# Patient Record
Sex: Male | Born: 1995 | Hispanic: Yes | Marital: Single | Smoking: Never smoker
Health system: Southern US, Community
[De-identification: ages and names within clinical notes are randomized; demographics above are authoritative.]

## PROBLEM LIST (undated history)

## (undated) HISTORY — PX: APPENDECTOMY: SHX54

---
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
## 2019-08-21 ENCOUNTER — Observation Stay
Admission: EM | Admit: 2019-08-21 | Discharge: 2019-08-22 | Disposition: A | Discharge status: Home / Self Care | Payer: Managed Care, Other (non HMO) | Attending: Surgery | Admitting: Surgery

## 2019-08-21 ENCOUNTER — Encounter: Payer: Self-pay | Admitting: Radiology

## 2019-08-21 ENCOUNTER — Other Ambulatory Visit: Payer: Self-pay

## 2019-08-21 ENCOUNTER — Emergency Department: Payer: Managed Care, Other (non HMO)

## 2019-08-21 DIAGNOSIS — K3580 Unspecified acute appendicitis: Secondary | ICD-10-CM | POA: Diagnosis not present

## 2019-08-21 DIAGNOSIS — Z20828 Contact with and (suspected) exposure to other viral communicable diseases: Secondary | ICD-10-CM | POA: Insufficient documentation

## 2019-08-21 LAB — CBC
HCT: 41.4 % (ref 39.0–52.0)
Hemoglobin: 14.2 g/dL (ref 13.0–17.0)
MCH: 32.1 pg (ref 26.0–34.0)
MCHC: 34.3 g/dL (ref 30.0–36.0)
MCV: 93.7 fL (ref 80.0–100.0)
Platelets: 191 10*3/uL (ref 150–400)
RBC: 4.42 MIL/uL (ref 4.22–5.81)
RDW: 11.9 % (ref 11.5–15.5)
WBC: 12.8 10*3/uL — ABNORMAL HIGH (ref 4.0–10.5)
nRBC: 0 % (ref 0.0–0.2)

## 2019-08-21 LAB — COMPREHENSIVE METABOLIC PANEL
ALT: 14 U/L (ref 0–44)
AST: 20 U/L (ref 15–41)
Albumin: 4.5 g/dL (ref 3.5–5.0)
Alkaline Phosphatase: 67 U/L (ref 38–126)
Anion gap: 9 (ref 5–15)
BUN: 15 mg/dL (ref 6–20)
CO2: 25 mmol/L (ref 22–32)
Calcium: 9.1 mg/dL (ref 8.9–10.3)
Chloride: 104 mmol/L (ref 98–111)
Creatinine, Ser: 0.83 mg/dL (ref 0.61–1.24)
GFR calc Af Amer: >60 mL/min (ref >60)
GFR calc non Af Amer: >60 mL/min (ref >60)
Glucose, Bld: 112 mg/dL — ABNORMAL HIGH (ref 70–99)
Potassium: 3.3 mmol/L — ABNORMAL LOW (ref 3.5–5.1)
Sodium: 138 mmol/L (ref 135–145)
Total Bilirubin: 1.7 mg/dL — ABNORMAL HIGH (ref 0.3–1.2)
Total Protein: 7.3 g/dL (ref 6.5–8.1)

## 2019-08-21 LAB — URINALYSIS, COMPLETE (UACMP) WITH MICROSCOPIC
Bacteria, UA: NONE SEEN
Bilirubin Urine: NEGATIVE
Glucose, UA: NEGATIVE mg/dL
Hgb urine dipstick: NEGATIVE
Ketones, ur: NEGATIVE mg/dL
Leukocytes,Ua: NEGATIVE
Nitrite: NEGATIVE
Protein, ur: 30 mg/dL — AB
Specific Gravity, Urine: 1.031 — ABNORMAL HIGH (ref 1.005–1.030)
pH: 6 (ref 5.0–8.0)

## 2019-08-21 LAB — LIPASE, BLOOD: Lipase: 20 U/L (ref 11–51)

## 2019-08-21 LAB — SARS CORONAVIRUS 2 BY RT PCR (HOSPITAL ORDER, PERFORMED IN ~~LOC~~ HOSPITAL LAB): SARS Coronavirus 2: NEGATIVE

## 2019-08-21 MED ORDER — PIPERACILLIN-TAZOBACTAM 3.375 G IVPB 30 MIN
3.3750 g | Freq: Once | INTRAVENOUS | Status: AC
  Administered 2019-08-21: 22:00:00 3.375 g via INTRAVENOUS
  Filled 2019-08-21: qty 50

## 2019-08-21 MED ORDER — LACTATED RINGERS IV SOLN
INTRAVENOUS | Status: DC
  Administered 2019-08-21: 22:00:00 via INTRAVENOUS

## 2019-08-21 MED ORDER — MORPHINE SULFATE (PF) 4 MG/ML IV SOLN
4.0000 mg | Freq: Once | INTRAVENOUS | Status: AC
  Administered 2019-08-21: 4 mg via INTRAVENOUS
  Filled 2019-08-21: qty 1

## 2019-08-21 MED ORDER — HYDROCODONE-ACETAMINOPHEN 5-325 MG PO TABS
1.0000 | ORAL_TABLET | ORAL | Status: DC | PRN
  Administered 2019-08-22 (×2): 2 via ORAL
  Filled 2019-08-21 (×2): qty 2

## 2019-08-21 MED ORDER — TRAMADOL HCL 50 MG PO TABS: 50.0000 mg | ORAL_TABLET | Freq: Four times a day (QID) | ORAL | Status: DC | PRN

## 2019-08-21 MED ORDER — ONDANSETRON HCL 4 MG/2ML IJ SOLN
4.0000 mg | Freq: Once | INTRAMUSCULAR | Status: AC
  Administered 2019-08-21: 4 mg via INTRAVENOUS
  Filled 2019-08-21: qty 2

## 2019-08-21 MED ORDER — ONDANSETRON 4 MG PO TBDP: 4.0000 mg | ORAL_TABLET | Freq: Four times a day (QID) | ORAL | Status: DC | PRN

## 2019-08-21 MED ORDER — ONDANSETRON HCL 4 MG/2ML IJ SOLN: 4.0000 mg | Freq: Four times a day (QID) | INTRAMUSCULAR | Status: DC | PRN

## 2019-08-21 MED ORDER — DOCUSATE SODIUM 100 MG PO CAPS: 100.0000 mg | ORAL_CAPSULE | Freq: Two times a day (BID) | ORAL | Status: DC | PRN

## 2019-08-21 MED ORDER — MORPHINE SULFATE (PF) 2 MG/ML IV SOLN
2.0000 mg | INTRAVENOUS | Status: DC | PRN
  Administered 2019-08-22: 2 mg via INTRAVENOUS
  Filled 2019-08-21: qty 1

## 2019-08-21 MED ORDER — IOHEXOL 300 MG/ML  SOLN
100.0000 mL | Freq: Once | INTRAMUSCULAR | Status: AC | PRN
  Administered 2019-08-21: 20:00:00 100 mL via INTRAVENOUS
  Filled 2019-08-21: qty 100

## 2019-08-21 NOTE — H&P (Signed)
Subjective:   CC: acute appendicitis  HPI:  Anthony Avery is a 23 y.o. male who is consulted by Cyril LoosenKinner for evaluation of  above cc.  Symptoms were first noted 2 days ago. Pain is sharp, worsening, localized to periumbilical region.  Associated with nothing specific, exacerbated by nothing specific.     Past Medical History: none reported  Past Surgical History: none reported  Family History: reviewed and not relevant to CC  Social History: denies tobacco, alcohol  Current Medications: none reported  Allergies:  Allergies as of 08/21/2019  . (No Known Allergies)    ROS:  General: Denies weight loss, weight gain, fatigue, fevers, chills, and night sweats. Eyes: Denies blurry vision, double vision, eye pain, itchy eyes, and tearing. Ears: Denies hearing loss, earache, and ringing in ears. Nose: Denies sinus pain, congestion, infections, runny nose, and nosebleeds. Mouth/throat: Denies hoarseness, sore throat, bleeding gums, and difficulty swallowing. Heart: Denies chest pain, palpitations, racing heart, irregular heartbeat, leg pain or swelling, and decreased activity tolerance. Respiratory: Denies breathing difficulty, shortness of breath, wheezing, cough, and sputum. GI: Denies change in appetite, heartburn, nausea, vomiting, constipation, diarrhea, and blood in stool. GU: Denies difficulty urinating, pain with urinating, urgency, frequency, blood in urine. Musculoskeletal: Denies joint stiffness, pain, swelling, muscle weakness. Skin: Denies rash, itching, mass, tumors, sores, and boils Neurologic: Denies headache, fainting, dizziness, seizures, numbness, and tingling. Psychiatric: Denies depression, anxiety, difficulty sleeping, and memory loss. Endocrine: Denies heat or cold intolerance, and increased thirst or urination. Blood/lymph: Denies easy bruising, easy bruising, and swollen glands     Objective:     BP (!) 146/73   Pulse (!) 57   Temp 98.6 F (37 C)  (Oral)   Resp 20   Ht 6' (1.829 m)   Wt 83.9 kg   SpO2 99%   BMI 25.09 kg/m    Constitutional :  alert, cooperative, appears stated age and no distress  Lymphatics/Throat:  no asymmetry, masses, or scars  Respiratory:  clear to auscultation bilaterally  Cardiovascular:  regular rate and rhythm  Gastrointestinal: soft, non-tender; bowel sounds normal; no masses,  no organomegaly.   Musculoskeletal: Steady gait and movement  Skin: Cool and moist.  Psychiatric: Normal affect, non-agitated, not confused       LABS:  CMP Latest Ref Rng & Units 08/21/2019  Glucose 70 - 99 mg/dL 161(W112(H)  BUN 6 - 20 mg/dL 15  Creatinine 9.600.61 - 4.541.24 mg/dL 0.980.83  Sodium 119135 - 147145 mmol/L 138  Potassium 3.5 - 5.1 mmol/L 3.3(L)  Chloride 98 - 111 mmol/L 104  CO2 22 - 32 mmol/L 25  Calcium 8.9 - 10.3 mg/dL 9.1  Total Protein 6.5 - 8.1 g/dL 7.3  Total Bilirubin 0.3 - 1.2 mg/dL 8.2(N1.7(H)  Alkaline Phos 38 - 126 U/L 67  AST 15 - 41 U/L 20  ALT 0 - 44 U/L 14   CBC Latest Ref Rng & Units 08/21/2019  WBC 4.0 - 10.5 K/uL 12.8(H)  Hemoglobin 13.0 - 17.0 g/dL 56.214.2  Hematocrit 13.039.0 - 52.0 % 41.4  Platelets 150 - 400 K/uL 191     RADS: CLINICAL DATA:  Abdominal pain, mid abdominal pain for 1 day  EXAM: CT ABDOMEN AND PELVIS WITH CONTRAST  TECHNIQUE: Multidetector CT imaging of the abdomen and pelvis was performed using the standard protocol following bolus administration of intravenous contrast.  CONTRAST:  100mL OMNIPAQUE IOHEXOL 300 MG/ML  SOLN  COMPARISON:  None.  FINDINGS: Lower chest: Lung bases are clear. Normal heart  size. No pericardial effusion.  Hepatobiliary: No focal liver abnormality is seen. No gallstones, gallbladder wall thickening, or biliary dilatation.  Pancreas: Unremarkable. No pancreatic ductal dilatation or surrounding inflammatory changes.  Spleen: Normal in size without focal abnormality.  Adrenals/Urinary Tract: Adrenal glands are unremarkable. Kidneys  are normal, without renal calculi, focal lesion, or hydronephrosis. Bladder is unremarkable.  Stomach/Bowel: Distal esophagus, stomach and duodenal sweep are unremarkable. No bowel wall thickening or dilatation. No evidence of obstruction. Fluid-filled, hyperemic and dilated (11 mm) appendix in the right lower quadrant (2/53-65) with surrounding periappendiceal inflammation. No extraluminal gas or adjacent periappendiceal fluid collection. No colonic dilatation or wall thickening.  Vascular/Lymphatic: The aorta is normal caliber. Reactive nodes present in the right lower quadrant. No suspicious or enlarged lymph nodes in the included lymphatic chains.  Reproductive: The prostate and seminal vesicles are unremarkable.  Other: No abdominal wall hernia or abnormality. No abdominopelvic ascites.  Musculoskeletal: No acute osseous abnormality or suspicious osseous lesion.  IMPRESSION: Acute uncomplicated appendicitis. No evidence of perforation or abscess.   Electronically Signed   By: Lovena Le M.D.   On: 08/21/2019 20:26   Assessment:      Acute appendicitis  Plan:      Discussed the risk of surgery including post-op infxn, seroma, hematoma, abscess formation, chronic pain, poor-delayed wound healing, possible bowel resection, possible ostomy, possible conversion to open procedure, post-op SBO or ileus, and need for additional procedures to address said risks.  The risks of general anesthetic including MI, CVA, sudden death or even reaction to anesthetic medications also discussed. Alternatives include continued observation, or antibiotic treatment.  Benefits include possible symptom relief,   Typical post operative recovery of 3-5 days rest, also discussed.  The patient understands the risks, any and all questions were answered to the patient's satisfaction.  To OR after NPO for 8hrs.  Zosyn, IVF, pain control in the meantime.  Pt and family member agreeable  to plan

## 2019-08-21 NOTE — ED Notes (Signed)
Pt resting on stretcher with family at the bedside. Iv antibiotics infusing without difficulty. No acute distress noted. Pt reports pain has improved. awaiting admission.

## 2019-08-21 NOTE — ED Notes (Signed)
Pt ate chicken tacos at 1630. Per surgery pt would not qualify for surgical intervention around midnight. Family and patient aware.

## 2019-08-21 NOTE — ED Notes (Signed)
Dr. Kinner at the bedside for pt evaluation.  

## 2019-08-21 NOTE — ED Notes (Signed)
Surgeon at bedside with patient and patient's mother.

## 2019-08-21 NOTE — ED Notes (Signed)
Pt to CT

## 2019-08-21 NOTE — Anesthesia Preprocedure Evaluation (Signed)
Anesthesia Evaluation  Patient identified by MRN, date of birth, ID band Patient awake    Reviewed: Allergy & Precautions, NPO status , Patient's Chart, lab work & pertinent test results  Airway Mallampati: II  TM Distance: >3 FB     Dental  (+) Teeth Intact   Pulmonary neg pulmonary ROS,    Pulmonary exam normal        Cardiovascular negative cardio ROS Normal cardiovascular exam     Neuro/Psych negative neurological ROS     GI/Hepatic Neg liver ROS,   Endo/Other  negative endocrine ROS  Renal/GU negative Renal ROS  negative genitourinary   Musculoskeletal negative musculoskeletal ROS (+)   Abdominal Normal abdominal exam  (+)   Peds negative pediatric ROS (+)  Hematology negative hematology ROS (+)   Anesthesia Other Findings History reviewed. No pertinent past medical history.  Reproductive/Obstetrics                             Anesthesia Physical Anesthesia Plan  ASA: I and emergent  Anesthesia Plan: General   Post-op Pain Management:    Induction: Intravenous, Rapid sequence and Cricoid pressure planned  PONV Risk Score and Plan:   Airway Management Planned: Oral ETT  Additional Equipment:   Intra-op Plan:   Post-operative Plan: Extubation in OR  Informed Consent: I have reviewed the patients History and Physical, chart, labs and discussed the procedure including the risks, benefits and alternatives for the proposed anesthesia with the patient or authorized representative who has indicated his/her understanding and acceptance.     Dental advisory given  Plan Discussed with: CRNA and Surgeon  Anesthesia Plan Comments:         Anesthesia Quick Evaluation

## 2019-08-21 NOTE — ED Triage Notes (Signed)
Pt in with co mid abd pain since yesterday, no hx of the same. Does have nausea, no vomiting or diarrhea.

## 2019-08-21 NOTE — ED Notes (Signed)
MD to bedside to update pt on results.

## 2019-08-21 NOTE — ED Provider Notes (Signed)
[facility] Emergency Department Provider Note   ____________________________________________    I have reviewed the triage vital signs and the nursing notes.   HISTORY  Chief Complaint Abdominal Pain     HPI Anthony Avery is a 23 y.o. male who presents with complaints of periumbilical abdominal pain which is been ongoing for approximately 1-1/2 days.  He reports at times it is severe.  He denies vomiting or diarrhea.  No fevers or chills.  Is not take anything for this.  Is never had this before.  No history of surgery.  History reviewed. No pertinent past medical history.  There are no active problems to display for this patient.     Prior to Admission medications   Not on File     Allergies Patient has no known allergies.  No family history on file.  Social History Social History   Tobacco Use  . Smoking status: Not on file  Substance Use Topics  . Alcohol use: Not on file  . Drug use: Not on file    Review of Systems  Constitutional: No fever/chills Eyes: No visual changes.  ENT: No sore throat. Cardiovascular: Denies chest pain. Respiratory: Denies shortness of breath. Gastrointestinal: As above Genitourinary: Negative for dysuria.  No hematuria Musculoskeletal: Negative for back pain. Skin: Negative for rash. Neurological: Negative for headaches or weakness   ____________________________________________   PHYSICAL EXAM:  VITAL SIGNS: ED Triage Vitals [08/21/19 1902]  Enc Vitals Group     BP (!) 146/73     Pulse Rate (!) 57     Resp 20     Temp 98.6 F (37 C)     Temp Source Oral     SpO2 99 %     Weight 83.9 kg (185 lb)     Height 1.829 m (6')     Head Circumference      Peak Flow      Pain Score 9     Pain Loc      Pain Edu?      Excl. in [place]?     Constitutional: Alert and oriented.   Nose: No congestion/rhinnorhea. Mouth/Throat: Mucous membranes are moist.    Cardiovascular: Normal rate,  regular rhythm. Grossly normal heart sounds.  Good peripheral circulation. Respiratory: Normal respiratory effort.  No retractions. Lungs CTAB. Gastrointestinal: Mild tenderness in the right lower quadrant. No distention.  No CVA tenderness. Genitourinary: deferred Musculoskeletal:  Warm and well perfused Neurologic:  Normal speech and language. No gross focal neurologic deficits are appreciated.  Skin:  Skin is warm, dry and intact. No rash noted. Psychiatric: Mood and affect are normal. Speech and behavior are normal.  ____________________________________________   LABS (all labs ordered are listed, but only abnormal results are displayed)  Labs Reviewed  CBC - Abnormal; Notable for the following components:      Result Value   WBC 12.8 (*)    All other components within normal limits  COMPREHENSIVE METABOLIC PANEL - Abnormal; Notable for the following components:   Potassium 3.3 (*)    Glucose, Bld 112 (*)    Total Bilirubin 1.7 (*)    All other components within normal limits  URINALYSIS, COMPLETE (UACMP) WITH MICROSCOPIC - Abnormal; Notable for the following components:   Color, Urine YELLOW (*)    APPearance CLEAR (*)    Specific Gravity, Urine 1.031 (*)    Protein, ur 30 (*)    All other components within normal limits  SARS CORONAVIRUS  2 (HOSPITAL ORDER, PERFORMED IN  HOSPITAL LAB)  LIPASE, BLOOD   ____________________________________________  EKG  None ____________________________________________  RADIOLOGY  CT scan demonstrates uncomplicated appendicitis ____________________________________________   PROCEDURES  Procedure(s) performed: No  Procedures   Critical Care performed: No ____________________________________________   INITIAL IMPRESSION / ASSESSMENT AND PLAN / ED COURSE  Pertinent labs & imaging results that were available during my care of the patient were reviewed by me and considered in my medical decision making (see chart  for details).  Patient presents with periumbilical pain he has minimal tenderness in the right lower quadrant, lab work demonstrates mild elevation in white blood cell count, possibility of gastroenteritis although given tenderness right lower quadrant will obtain CT abdomen pelvis.  CT scan demonstrates acute uncomplicated appendicitis.  Discussed with Dr. Tonna BoehringerSakai of general surgery who will admit the patient for surgery    ____________________________________________   FINAL CLINICAL IMPRESSION(S) / ED DIAGNOSES  Final diagnoses:  Acute appendicitis, unspecified acute appendicitis type        Note:  This document was prepared using Dragon voice recognition software and may include unintentional dictation errors.   Jene EveryKinner, Vale Mousseau, MD 08/21/19 [phone]

## 2019-08-22 ENCOUNTER — Encounter
Admission: EM | Disposition: A | Discharge status: Home / Self Care | Payer: Self-pay | Source: Home / Self Care | Attending: Emergency Medicine

## 2019-08-22 ENCOUNTER — Observation Stay: Payer: Managed Care, Other (non HMO) | Admitting: Anesthesiology

## 2019-08-22 ENCOUNTER — Encounter: Payer: Self-pay | Admitting: Anesthesiology

## 2019-08-22 HISTORY — PX: LAPAROSCOPIC APPENDECTOMY: SHX408

## 2019-08-22 LAB — PHOSPHORUS: Phosphorus: 2.9 mg/dL (ref 2.5–4.6)

## 2019-08-22 LAB — BASIC METABOLIC PANEL
Anion gap: 8 (ref 5–15)
BUN: 9 mg/dL (ref 6–20)
CO2: 26 mmol/L (ref 22–32)
Calcium: 9 mg/dL (ref 8.9–10.3)
Chloride: 105 mmol/L (ref 98–111)
Creatinine, Ser: 0.88 mg/dL (ref 0.61–1.24)
GFR calc Af Amer: >60 mL/min (ref >60)
GFR calc non Af Amer: >60 mL/min (ref >60)
Glucose, Bld: 145 mg/dL — ABNORMAL HIGH (ref 70–99)
Potassium: 4.2 mmol/L (ref 3.5–5.1)
Sodium: 139 mmol/L (ref 135–145)

## 2019-08-22 LAB — CBC
HCT: 40.1 % (ref 39.0–52.0)
Hemoglobin: 13.5 g/dL (ref 13.0–17.0)
MCH: 31.7 pg (ref 26.0–34.0)
MCHC: 33.7 g/dL (ref 30.0–36.0)
MCV: 94.1 fL (ref 80.0–100.0)
Platelets: 173 10*3/uL (ref 150–400)
RBC: 4.26 MIL/uL (ref 4.22–5.81)
RDW: 11.8 % (ref 11.5–15.5)
WBC: 11.5 10*3/uL — ABNORMAL HIGH (ref 4.0–10.5)
nRBC: 0 % (ref 0.0–0.2)

## 2019-08-22 LAB — SURGICAL PCR SCREEN
MRSA, PCR: NEGATIVE
Staphylococcus aureus: NEGATIVE

## 2019-08-22 LAB — MAGNESIUM: Magnesium: 1.8 mg/dL (ref 1.7–2.4)

## 2019-08-22 SURGERY — APPENDECTOMY, LAPAROSCOPIC
Anesthesia: General | Site: Abdomen

## 2019-08-22 MED ORDER — ROCURONIUM BROMIDE 100 MG/10ML IV SOLN
INTRAVENOUS | Status: DC | PRN
  Administered 2019-08-22: 30 mg via INTRAVENOUS
  Administered 2019-08-22 (×2): 10 mg via INTRAVENOUS

## 2019-08-22 MED ORDER — FENTANYL CITRATE (PF) 100 MCG/2ML IJ SOLN
INTRAMUSCULAR | Status: AC
  Filled 2019-08-22: qty 2

## 2019-08-22 MED ORDER — BUPIVACAINE-EPINEPHRINE 0.5% -1:200000 IJ SOLN
INTRAMUSCULAR | Status: DC | PRN
  Administered 2019-08-22: 23 mL

## 2019-08-22 MED ORDER — ONDANSETRON HCL 4 MG/2ML IJ SOLN
INTRAMUSCULAR | Status: AC
  Filled 2019-08-22: qty 2

## 2019-08-22 MED ORDER — LACTATED RINGERS IV SOLN
INTRAVENOUS | Status: DC | PRN
  Administered 2019-08-22 (×2): via INTRAVENOUS

## 2019-08-22 MED ORDER — ONDANSETRON HCL 4 MG/2ML IJ SOLN: 4.0000 mg | Freq: Once | INTRAMUSCULAR | Status: DC | PRN

## 2019-08-22 MED ORDER — SUGAMMADEX SODIUM 500 MG/5ML IV SOLN
INTRAVENOUS | Status: DC | PRN
  Administered 2019-08-22: 180 mg via INTRAVENOUS

## 2019-08-22 MED ORDER — FENTANYL CITRATE (PF) 100 MCG/2ML IJ SOLN: 25.0000 ug | INTRAMUSCULAR | Status: DC | PRN

## 2019-08-22 MED ORDER — SUCCINYLCHOLINE CHLORIDE 20 MG/ML IJ SOLN
INTRAMUSCULAR | Status: AC
  Filled 2019-08-22: qty 1

## 2019-08-22 MED ORDER — SUCCINYLCHOLINE CHLORIDE 20 MG/ML IJ SOLN
INTRAMUSCULAR | Status: DC | PRN
  Administered 2019-08-22: 100 mg via INTRAVENOUS

## 2019-08-22 MED ORDER — DOCUSATE SODIUM 100 MG PO CAPS: 100.0000 mg | ORAL_CAPSULE | Freq: Two times a day (BID) | ORAL | 0 refills | Status: AC | PRN

## 2019-08-22 MED ORDER — ACETAMINOPHEN 10 MG/ML IV SOLN
INTRAVENOUS | Status: DC | PRN
  Administered 2019-08-22: 1000 mg via INTRAVENOUS

## 2019-08-22 MED ORDER — SUGAMMADEX SODIUM 200 MG/2ML IV SOLN
INTRAVENOUS | Status: AC
  Filled 2019-08-22: qty 2

## 2019-08-22 MED ORDER — IBUPROFEN 800 MG PO TABS: 800.0000 mg | ORAL_TABLET | Freq: Three times a day (TID) | ORAL | 0 refills | Status: AC | PRN

## 2019-08-22 MED ORDER — LIDOCAINE HCL (PF) 2 % IJ SOLN
INTRAMUSCULAR | Status: AC
  Filled 2019-08-22: qty 10

## 2019-08-22 MED ORDER — TRAMADOL HCL 50 MG PO TABS: 50.0000 mg | ORAL_TABLET | Freq: Four times a day (QID) | ORAL | 0 refills | Status: AC | PRN

## 2019-08-22 MED ORDER — BUPIVACAINE-EPINEPHRINE (PF) 0.5% -1:200000 IJ SOLN
INTRAMUSCULAR | Status: AC
  Filled 2019-08-22: qty 30

## 2019-08-22 MED ORDER — MIDAZOLAM HCL 2 MG/2ML IJ SOLN
INTRAMUSCULAR | Status: AC
  Filled 2019-08-22: qty 2

## 2019-08-22 MED ORDER — ONDANSETRON HCL 4 MG/2ML IJ SOLN
INTRAMUSCULAR | Status: DC | PRN
  Administered 2019-08-22: 4 mg via INTRAVENOUS

## 2019-08-22 MED ORDER — LIDOCAINE HCL (CARDIAC) PF 100 MG/5ML IV SOSY
PREFILLED_SYRINGE | INTRAVENOUS | Status: DC | PRN
  Administered 2019-08-22: 50 mg via INTRAVENOUS

## 2019-08-22 MED ORDER — DEXAMETHASONE SODIUM PHOSPHATE 10 MG/ML IJ SOLN
INTRAMUSCULAR | Status: DC | PRN
  Administered 2019-08-22: 10 mg via INTRAVENOUS

## 2019-08-22 MED ORDER — PROPOFOL 10 MG/ML IV BOLUS
INTRAVENOUS | Status: AC
  Filled 2019-08-22: qty 20

## 2019-08-22 MED ORDER — DEXMEDETOMIDINE HCL IN NACL 400 MCG/100ML IV SOLN
INTRAVENOUS | Status: DC | PRN
  Administered 2019-08-22: 4 ug via INTRAVENOUS
  Administered 2019-08-22: 2 ug via INTRAVENOUS

## 2019-08-22 MED ORDER — ACETAMINOPHEN 325 MG PO TABS: 650.0000 mg | ORAL_TABLET | Freq: Three times a day (TID) | ORAL | 0 refills | Status: AC | PRN

## 2019-08-22 MED ORDER — GLYCOPYRROLATE 0.2 MG/ML IJ SOLN
INTRAMUSCULAR | Status: DC | PRN
  Administered 2019-08-22: 0.2 mg via INTRAVENOUS

## 2019-08-22 MED ORDER — MIDAZOLAM HCL 2 MG/2ML IJ SOLN
INTRAMUSCULAR | Status: DC | PRN
  Administered 2019-08-22: 2 mg via INTRAVENOUS

## 2019-08-22 MED ORDER — ACETAMINOPHEN 10 MG/ML IV SOLN
INTRAVENOUS | Status: AC
  Filled 2019-08-22: qty 100

## 2019-08-22 MED ORDER — FENTANYL CITRATE (PF) 100 MCG/2ML IJ SOLN
INTRAMUSCULAR | Status: DC | PRN
  Administered 2019-08-22 (×5): 50 ug via INTRAVENOUS

## 2019-08-22 MED ORDER — DEXAMETHASONE SODIUM PHOSPHATE 10 MG/ML IJ SOLN
INTRAMUSCULAR | Status: AC
  Filled 2019-08-22: qty 1

## 2019-08-22 MED ORDER — SODIUM CHLORIDE 0.9 % IR SOLN
Status: DC | PRN
  Administered 2019-08-22: 500 mL

## 2019-08-22 MED ORDER — PROPOFOL 10 MG/ML IV BOLUS
INTRAVENOUS | Status: DC | PRN
  Administered 2019-08-22: 200 mg via INTRAVENOUS

## 2019-08-22 MED ORDER — KETOROLAC TROMETHAMINE 30 MG/ML IJ SOLN
30.0000 mg | Freq: Four times a day (QID) | INTRAMUSCULAR | Status: DC
  Administered 2019-08-22: 16:00:00 30 mg via INTRAVENOUS
  Filled 2019-08-22: qty 1

## 2019-08-22 MED ORDER — ROCURONIUM BROMIDE 50 MG/5ML IV SOLN
INTRAVENOUS | Status: AC
  Filled 2019-08-22: qty 1

## 2019-08-22 MED ORDER — SEVOFLURANE IN SOLN
RESPIRATORY_TRACT | Status: AC
  Filled 2019-08-22: qty 250

## 2019-08-22 SURGICAL SUPPLY — 59 items
ANCHOR TIS RET SYS 235ML (MISCELLANEOUS) ×3 IMPLANT
APPLIER CLIP 5 13 M/L LIGAMAX5 (MISCELLANEOUS)
BLADE SURG SZ11 CARB STEEL (BLADE) ×3 IMPLANT
BULB RESERV EVAC DRAIN JP 100C (MISCELLANEOUS) ×2 IMPLANT
CANISTER SUCT 1200ML W/VALVE (MISCELLANEOUS) ×3 IMPLANT
CLIP APPLIE 5 13 M/L LIGAMAX5 (MISCELLANEOUS) IMPLANT
COVER WAND RF STERILE (DRAPES) IMPLANT
CUTTER FLEX LINEAR 45M (STAPLE) ×3 IMPLANT
DEFOGGER SCOPE WARMER CLEARIFY (MISCELLANEOUS) ×2 IMPLANT
DERMABOND ADVANCED (GAUZE/BANDAGES/DRESSINGS) ×2
DERMABOND ADVANCED .7 DNX12 (GAUZE/BANDAGES/DRESSINGS) ×1 IMPLANT
DRAIN CHANNEL JP 15F RND 16 (MISCELLANEOUS) ×2 IMPLANT
ELECT REM PT RETURN 9FT ADLT (ELECTROSURGICAL) ×3
ELECTRODE REM PT RTRN 9FT ADLT (ELECTROSURGICAL) ×1 IMPLANT
GLOVE BIO SURGEON STRL SZ 6.5 (GLOVE) ×1 IMPLANT
GLOVE BIO SURGEON STRL SZ7 (GLOVE) ×2 IMPLANT
GLOVE BIO SURGEONS STRL SZ 6.5 (GLOVE) ×1
GLOVE BIOGEL PI IND STRL 6.5 (GLOVE) IMPLANT
GLOVE BIOGEL PI IND STRL 7.0 (GLOVE) ×1 IMPLANT
GLOVE BIOGEL PI INDICATOR 6.5 (GLOVE) ×2
GLOVE BIOGEL PI INDICATOR 7.0 (GLOVE) ×2
GLOVE SURG SYN 6.5 ES PF (GLOVE) ×3 IMPLANT
GLOVE SURG SYN 6.5 PF PI (GLOVE) ×1 IMPLANT
GOWN STRL REUS W/ TWL LRG LVL3 (GOWN DISPOSABLE) ×1 IMPLANT
GOWN STRL REUS W/TWL LRG LVL3 (GOWN DISPOSABLE) ×4
GRASPER SUT TROCAR 14GX15 (MISCELLANEOUS) ×3 IMPLANT
HANDLE YANKAUER SUCT BULB TIP (MISCELLANEOUS) ×3 IMPLANT
IRRIGATION STRYKERFLOW (MISCELLANEOUS) IMPLANT
IRRIGATOR STRYKERFLOW (MISCELLANEOUS) ×3
IV NS 1000ML (IV SOLUTION) ×2
IV NS 1000ML BAXH (IV SOLUTION) IMPLANT
KIT TURNOVER KIT A (KITS) ×3 IMPLANT
L-HOOK LAP DISP 36CM (ELECTROSURGICAL) ×3
LHOOK LAP DISP 36CM (ELECTROSURGICAL) ×1 IMPLANT
LIGASURE LAP MARYLAND 5MM 37CM (ELECTROSURGICAL) ×2 IMPLANT
NEEDLE HYPO 22GX1.5 SAFETY (NEEDLE) ×3 IMPLANT
PACK LAP CHOLECYSTECTOMY (MISCELLANEOUS) ×3 IMPLANT
PENCIL ELECTRO HAND CTR (MISCELLANEOUS) ×3 IMPLANT
RELOAD 45 VASCULAR/THIN (ENDOMECHANICALS) IMPLANT
RELOAD STAPLE 45 2.5 WHT GRN (ENDOMECHANICALS) IMPLANT
RELOAD STAPLE 45 3.5 BLU ETS (ENDOMECHANICALS) ×1 IMPLANT
RELOAD STAPLE TA45 3.5 REG BLU (ENDOMECHANICALS) ×6 IMPLANT
SCISSORS METZENBAUM CVD 33 (INSTRUMENTS) ×1 IMPLANT
SET TUBE SMOKE EVAC HIGH FLOW (TUBING) ×3 IMPLANT
SLEEVE ENDOPATH XCEL 5M (ENDOMECHANICALS) ×5 IMPLANT
SPONGE DRAIN TRACH 4X4 STRL 2S (GAUZE/BANDAGES/DRESSINGS) ×2 IMPLANT
SUT ETHILON 3-0 FS-10 30 BLK (SUTURE) ×3
SUT ETHILON 4-0 (SUTURE)
SUT ETHILON 4-0 FS2 18XMFL BLK (SUTURE)
SUT MNCRL AB 4-0 PS2 18 (SUTURE) ×3 IMPLANT
SUT VIC AB 3-0 SH 27 (SUTURE) ×2
SUT VIC AB 3-0 SH 27X BRD (SUTURE) ×1 IMPLANT
SUT VICRYL 0 AB UR-6 (SUTURE) ×4 IMPLANT
SUT VICRYL PLUS ABS 0 54 (SUTURE) ×3 IMPLANT
SUTURE EHLN 3-0 FS-10 30 BLK (SUTURE) IMPLANT
SUTURE ETHLN 4-0 FS2 18XMF BLK (SUTURE) IMPLANT
TRAY FOLEY MTR SLVR 16FR STAT (SET/KITS/TRAYS/PACK) ×3 IMPLANT
TROCAR XCEL BLUNT TIP 100MML (ENDOMECHANICALS) ×2 IMPLANT
TROCAR XCEL NON-BLD 5MMX100MML (ENDOMECHANICALS) ×3 IMPLANT

## 2019-08-22 NOTE — Discharge Summary (Signed)
Physician Discharge Summary  Patient ID: Anthony Avery MRN: [id number] DOB/AGE: [birth year] 23 y.o.  Admit date: 08/21/2019 Discharge date: 08/22/2019  Admission Diagnoses: acute appendicitis  Discharge Diagnoses:  Same as above  Discharged Condition: good  Hospital Course: dx above.  Underwent lap appy.  See op note for details  Recovered without any issues.  At time of d/c, tolerating diet, pain controlled.  F/u office one week for likely drain removal.  Consults: None  Discharge Exam: Blood pressure 112/64, pulse (!) 51, temperature 99.1 F (37.3 C), temperature source Oral, resp. rate 20, height 6' (1.829 m), weight 90 kg, SpO2 100 %. General appearance: alert, cooperative and no distress GI: soft, no guarding, TTP at incision and drain site.  drain with serosanguinous minimal d/c  Disposition:  Discharge disposition: 01-Home or Self Care       Discharge Instructions    Discharge patient   Complete by: As directed    Discharge disposition: 01-Home or Self Care   Discharge patient date: 08/22/2019     Allergies as of 08/22/2019   No Known Allergies     Medication List    TAKE these medications   acetaminophen 325 MG tablet Commonly known as: Tylenol Take 2 tablets (650 mg total) by mouth every 8 (eight) hours as needed for mild pain.   docusate sodium 100 MG capsule Commonly known as: Colace Take 1 capsule (100 mg total) by mouth 2 (two) times daily as needed for up to 10 days for mild constipation.   ibuprofen 800 MG tablet Commonly known as: ADVIL Take 1 tablet (800 mg total) by mouth every 8 (eight) hours as needed for mild pain or moderate pain.   traMADol 50 MG tablet Commonly known as: Ultram Take 1 tablet (50 mg total) by mouth every 6 (six) hours as needed for up to 5 days.      Follow-up Information    Jeremyah Jelley, DO Follow up in 1 week(s).   Specialty: Surgery Why: for drain check and removal Contact information: [address]  [place] [address] [phone]            Total time spent arranging discharge was >22min. Signed: Benjamine Sprague 08/22/2019, 4:29 PM

## 2019-08-22 NOTE — Anesthesia Postprocedure Evaluation (Signed)
Anesthesia Post Note  Patient: Anthony Avery  Procedure(s) Performed: APPENDECTOMY LAPAROSCOPIC (N/A Abdomen)  Patient location during evaluation: PACU Anesthesia Type: General Level of consciousness: awake and alert and oriented Pain management: pain level controlled Vital Signs Assessment: post-procedure vital signs reviewed and stable Respiratory status: spontaneous breathing Cardiovascular status: blood pressure returned to baseline Anesthetic complications: no     Last Vitals:  Vitals:   08/22/19 0300 08/22/19 0305  BP: 128/76   Pulse: (!) 51 (!) 52  Resp: 17 15  Temp:    SpO2: 100% 100%    Last Pain:  Vitals:   08/22/19 0300  TempSrc:   PainSc: 0-No pain                 [name]

## 2019-08-22 NOTE — Anesthesia Post-op Follow-up Note (Signed)
Anesthesia QCDR form completed.        

## 2019-08-22 NOTE — Progress Notes (Signed)
Anthony Avery to be D/C'd Home per MD order.  Discussed prescriptions and follow up appointments with the patient. Prescriptions given to patient, medication list explained in detail. Educated patient and family member on drain maintenance and care. Pt verbalized understanding.  Allergies as of 08/22/2019   No Known Allergies     Medication List    TAKE these medications   acetaminophen 325 MG tablet Commonly known as: Tylenol Take 2 tablets (650 mg total) by mouth every 8 (eight) hours as needed for mild pain.   docusate sodium 100 MG capsule Commonly known as: Colace Take 1 capsule (100 mg total) by mouth 2 (two) times daily as needed for up to 10 days for mild constipation.   ibuprofen 800 MG tablet Commonly known as: ADVIL Take 1 tablet (800 mg total) by mouth every 8 (eight) hours as needed for mild pain or moderate pain.   traMADol 50 MG tablet Commonly known as: Ultram Take 1 tablet (50 mg total) by mouth every 6 (six) hours as needed for up to 5 days.       Vitals:   08/22/19 0559 08/22/19 1220  BP: 109/63 112/64  Pulse: (!) 46 (!) 51  Resp: 16 20  Temp: 98.3 F (36.8 C) 99.1 F (37.3 C)  SpO2: 98% 100%    Skin clean, dry and intact without evidence of skin break down, no evidence of skin tears noted. IV catheter discontinued intact. Site without signs and symptoms of complications. Dressing and pressure applied. Pt denies pain at this time. No complaints noted.  An After Visit Summary was printed and given to the patient. Patient escorted via [place], and D/C home via private auto.  Fuller Mandril, RN

## 2019-08-22 NOTE — Discharge Instructions (Signed)
Laparoscopic Appendectomy, Care After This sheet gives you information about how to care for yourself after your procedure. Your doctor may also give you more specific instructions. If you have problems or questions, contact your doctor. Follow these instructions at home: Care for cuts from surgery (incisions)   Follow instructions from your doctor about how to take care of your cuts from surgery. Make sure you: ? Wash your hands with soap and water before you change your bandage (dressing). If you cannot use soap and water, use hand sanitizer. ? Change your bandage as told by your doctor. ? Leave stitches (sutures), skin glue, or skin tape (adhesive) strips in place. They may need to stay in place for 2 weeks or longer. If tape strips get loose and curl up, you may trim the loose edges. Do not remove tape strips completely unless your doctor says it is okay.  Do not take baths, swim, or use a hot tub until your doctor says it is okay. OK TO SHOWER 24HRS AFTER YOUR SURGERY.   Check your surgical cut area every day for signs of infection. Check for: ? More redness, swelling, or pain. ? More fluid or blood. ? Warmth. ? Pus or a bad smell. Activity  Do not drive or use heavy machinery while taking prescription pain medicine.  Do not play contact sports until your doctor says it is okay.  Do not drive for 24 hours if you were given a medicine to help you relax (sedative).  Rest as needed. Do not return to work or school until your doctor says it is okay. General instructions   tylenol and advil as needed for discomfort.  Please alternate between the two every four hours as needed for pain.     Use narcotics, if prescribed, only when tylenol and motrin is not enough to control pain.   325-650mg  every 8hrs to max of 3000mg /24hrs for the tylenol.     Advil up to 800mg  per dose every 8hrs as needed for pain.    Measure drain output as instructed.  To prevent or treat constipation while  you are taking prescription pain medicine, your doctor may recommend that you: ? Drink enough fluid to keep your pee (urine) clear or pale yellow. ? Take over-the-counter or prescription medicines. ? Eat foods that are high in fiber, such as fresh fruits and vegetables, whole grains, and beans. ? Limit foods that are high in fat and processed sugars, such as fried and sweet foods. Contact a doctor if:  You develop a rash.  You have more redness, swelling, or pain around your surgical cuts.  You have more fluid or blood coming from your surgical cuts.  Your surgical cuts feel warm to the touch.  You have pus or a bad smell coming from your surgical cuts.  You have a fever.  One or more of your surgical cuts breaks open. Get help right away if:  You have trouble breathing.  You have chest pain.  You have pain that is getting worse in your shoulders.  You faint or feel dizzy when you stand.  You have very bad pain in your belly (abdomen).  You are sick to your stomach (nauseous) for more than one day.  You have throwing up (vomiting) that lasts for more than one day.  You have leg pain. This information is not intended to replace advice given to you by your health care provider. Make sure you discuss any questions you have with your health  care provider. Document Released: 09/01/2008 Document Revised: 06/13/2016 Document Reviewed: 05/11/2016 Elsevier Interactive Patient Education  2019 [employer]

## 2019-08-22 NOTE — Transfer of Care (Signed)
Immediate Anesthesia Transfer of Care Note  Patient: Anthony Avery  Procedure(s) Performed: APPENDECTOMY LAPAROSCOPIC (N/A Abdomen)  Patient Location: PACU  Anesthesia Type:General  Level of Consciousness: sedated  Airway & Oxygen Therapy: Patient Spontanous Breathing and Patient connected to face mask oxygen  Post-op Assessment: Report given to RN and Post -op Vital signs reviewed and stable  Post vital signs: Reviewed and stable  Last Vitals:  Vitals Value Taken Time  BP    Temp    Pulse    Resp    SpO2      Last Pain:  Vitals:   08/21/19 2237  TempSrc: Oral  PainSc:          Complications: No apparent anesthesia complications

## 2019-08-22 NOTE — Op Note (Signed)
Preoperative diagnosis: Acute appendicitis.  Postoperative diagnosis: Acute appendicitis  Procedure: Laparoscopic appendectomy.  Anesthesia: GETA  Surgeon: Benjamine Sprague  Wound Classification: clean contaminated  Specimen: Appendix  Complications: None  Estimated Blood Loss: 20 mL   Indications: Patient is a 23 y.o. male  presented with right lower quadrant pain.  Computed tomography scan and physical examination were consistent with acute appendicitis.   Findings: 1. Acutely inflamed appendix 2. No peri-appendiceal abscess or phlegmon 3. Appendix deep to small bowel, towards retroperitoneal area 4. Appendiceal artery ligated and divided with EndoGIA 5. Adequate hemostasis.   Description of procedure: The patient was placed on the operating table in the supine position, left arm tucked. General anesthesia was induced. A time-out was completed verifying correct patient, procedure, site, positioning, and implant(s) and/or special equipment prior to beginning this procedure. A Foley catheter placed. The abdomen was prepped and draped in the usual sterile fashion.   After local was infused, an incision was made inferior to the umbilicus.  The fascia was elevated with Coker's and 0 Vicryl stay sutures were placed on either side of the midline.  11 blade was then used to make an midline incision down through the fascia blunt dissection used to enter the peritoneal cavity under direct visualization.  Hasson port then placed through this and abdomen insufflated with carbon dioxide to a pressure of 15 mmHg. The patient tolerated insufflation well.  The laparoscope was inserted and the abdomen inspected. No injuries from initial trocar placement were noted. One 42mm port and another 5-mm port was then placed above the symphysis pubis on midline and LLQ.  Care was taken to avoid injury to the bladder or inferior epigastric vessels. The table was placed in the Trendelenburg position with the right  side elevated.   An inflamed appendix was identified and elevated posterior to the cecum, extending toward midline deep to small bowel mesentary.  An additional 50mm trocar was required in the RLQ to facilitate visualization of the long appendix to continue dissection.  Extensive dissection was needed with ligasure and electrocautery to eventually reach tip of appendix, which was brought into view after serial ligation of the mesoappendix with the ligasure.  Window created at base of appendix in the mesentery.  An endoscopic blue load linear cutting stapler was then used to divide and staple the base of the appendix.  The appendix was placed in an endoscopic retrieval bag and removed.   The appendiceal stump was examined and hemostasis noted. serosanguinous fluid and blood suctioned out.  No other pathology was identified within pelvis. Due to extensive dissection, Blake drain placed through RLQ port and secured to skin using 3-0 nylon.  The umbilical trocar removed and port site closed with PMI using 0 vicryl under direct vision. Remaining trocars were removed under direct vision. No bleeding was noted.The abdomen was allowed to collapse. Deep dermal then closed with 3-0 vicryl interrupted fashion at umbilical site.  All skin incisions then closed with subcuticular sutures Monocryl 4-0.  Wounds then dressed with dermabond.  The patient tolerated the procedure well, foley removed, awakened from anesthesia and was taken to the postanesthesia care unit in satisfactory condition.  Sponge count and instrument count correct at the end of the procedure.

## 2019-08-22 NOTE — Anesthesia Procedure Notes (Signed)
Procedure Name: Intubation Date/Time: 08/22/2019 12:48 AM Performed by: Lendon Colonel, CRNA Pre-anesthesia Checklist: Patient identified, Patient being monitored, Timeout performed, Emergency Drugs available and Suction available Patient Re-evaluated:Patient Re-evaluated prior to induction Oxygen Delivery Method: Circle system utilized Preoxygenation: Pre-oxygenation with 100% oxygen Induction Type: IV induction, Rapid sequence and Cricoid Pressure applied Laryngoscope Size: Miller and 2 Grade View: Grade I Tube type: Oral Tube size: 7.5 mm Number of attempts: 1 Airway Equipment and Method: Stylet Placement Confirmation: ETT inserted through vocal cords under direct vision,  positive ETCO2 and breath sounds checked- equal and bilateral Secured at: 21 cm Tube secured with: Tape Dental Injury: Teeth and Oropharynx as per pre-operative assessment

## 2019-08-23 LAB — HIV ANTIBODY (ROUTINE TESTING W REFLEX): HIV Screen 4th Generation wRfx: NONREACTIVE

## 2019-08-24 LAB — SURGICAL PATHOLOGY

## 2020-05-04 ENCOUNTER — Emergency Department
Admission: EM | Admit: 2020-05-04 | Discharge: 2020-05-04 | Disposition: A | Discharge status: Home / Self Care | Payer: 59 | Attending: Emergency Medicine | Admitting: Emergency Medicine

## 2020-05-04 ENCOUNTER — Emergency Department: Payer: 59

## 2020-05-04 ENCOUNTER — Other Ambulatory Visit: Payer: Self-pay

## 2020-05-04 DIAGNOSIS — Z5321 Procedure and treatment not carried out due to patient leaving prior to being seen by health care provider: Secondary | ICD-10-CM | POA: Insufficient documentation

## 2020-05-04 DIAGNOSIS — R0602 Shortness of breath: Secondary | ICD-10-CM | POA: Insufficient documentation

## 2020-05-04 DIAGNOSIS — R0789 Other chest pain: Secondary | ICD-10-CM | POA: Diagnosis present

## 2020-05-04 LAB — BASIC METABOLIC PANEL
Anion gap: 8 (ref 5–15)
BUN: 13 mg/dL (ref 6–20)
CO2: 24 mmol/L (ref 22–32)
Calcium: 8.9 mg/dL (ref 8.9–10.3)
Chloride: 107 mmol/L (ref 98–111)
Creatinine, Ser: 0.99 mg/dL (ref 0.61–1.24)
GFR calc Af Amer: >60 mL/min (ref >60)
GFR calc non Af Amer: >60 mL/min (ref >60)
Glucose, Bld: 118 mg/dL — ABNORMAL HIGH (ref 70–99)
Potassium: 3.5 mmol/L (ref 3.5–5.1)
Sodium: 139 mmol/L (ref 135–145)

## 2020-05-04 LAB — CBC
HCT: 41.4 % (ref 39.0–52.0)
Hemoglobin: 14.3 g/dL (ref 13.0–17.0)
MCH: 32.6 pg (ref 26.0–34.0)
MCHC: 34.5 g/dL (ref 30.0–36.0)
MCV: 94.3 fL (ref 80.0–100.0)
Platelets: 208 10*3/uL (ref 150–400)
RBC: 4.39 MIL/uL (ref 4.22–5.81)
RDW: 11.9 % (ref 11.5–15.5)
WBC: 7.8 10*3/uL (ref 4.0–10.5)
nRBC: 0 % (ref 0.0–0.2)

## 2020-05-04 LAB — TROPONIN I (HIGH SENSITIVITY): Troponin I (High Sensitivity): 6 ng/L (ref <18)

## 2020-05-04 NOTE — ED Triage Notes (Signed)
Pt arrives to ER c/o of upper central CP and SOB. Denies cough or fever. Symptoms x 2 days. States intermittent. Had COVID in 2020.

## 2020-05-04 NOTE — ED Notes (Signed)
Patient up to stat desk to ask about wait times. Patient informed of current place in line. Patient informed RN that he was leaving. Patient encouraged to stay. Patient left.

## 2020-09-28 IMAGING — CR DG CHEST 2V
2 series · 2 of 2 positions shown · non-contrast
Comparison: None.

CLINICAL DATA: Chest pain and shortness of breath

EXAM:
CHEST - 2 VIEW

[chest pa]
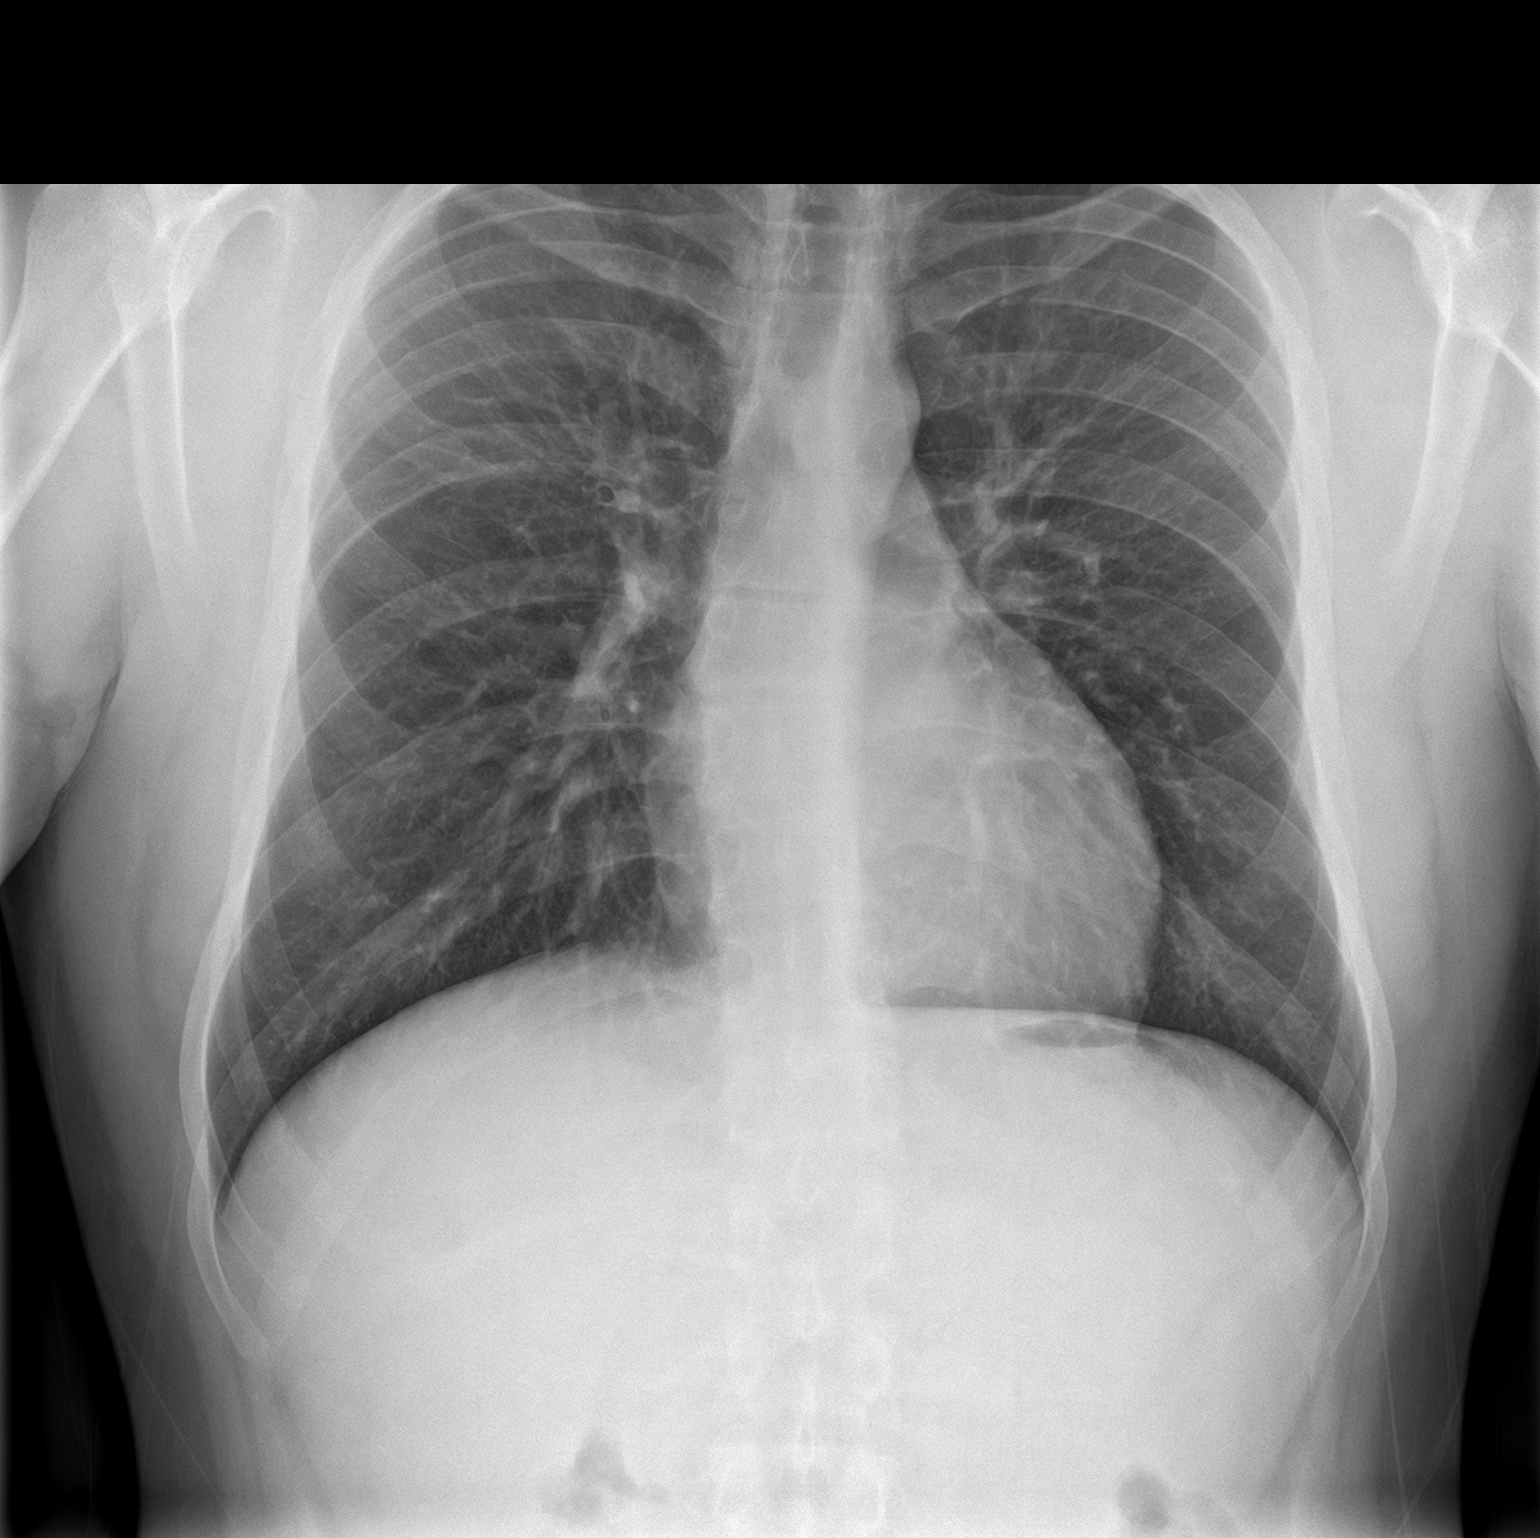

[chest lat]
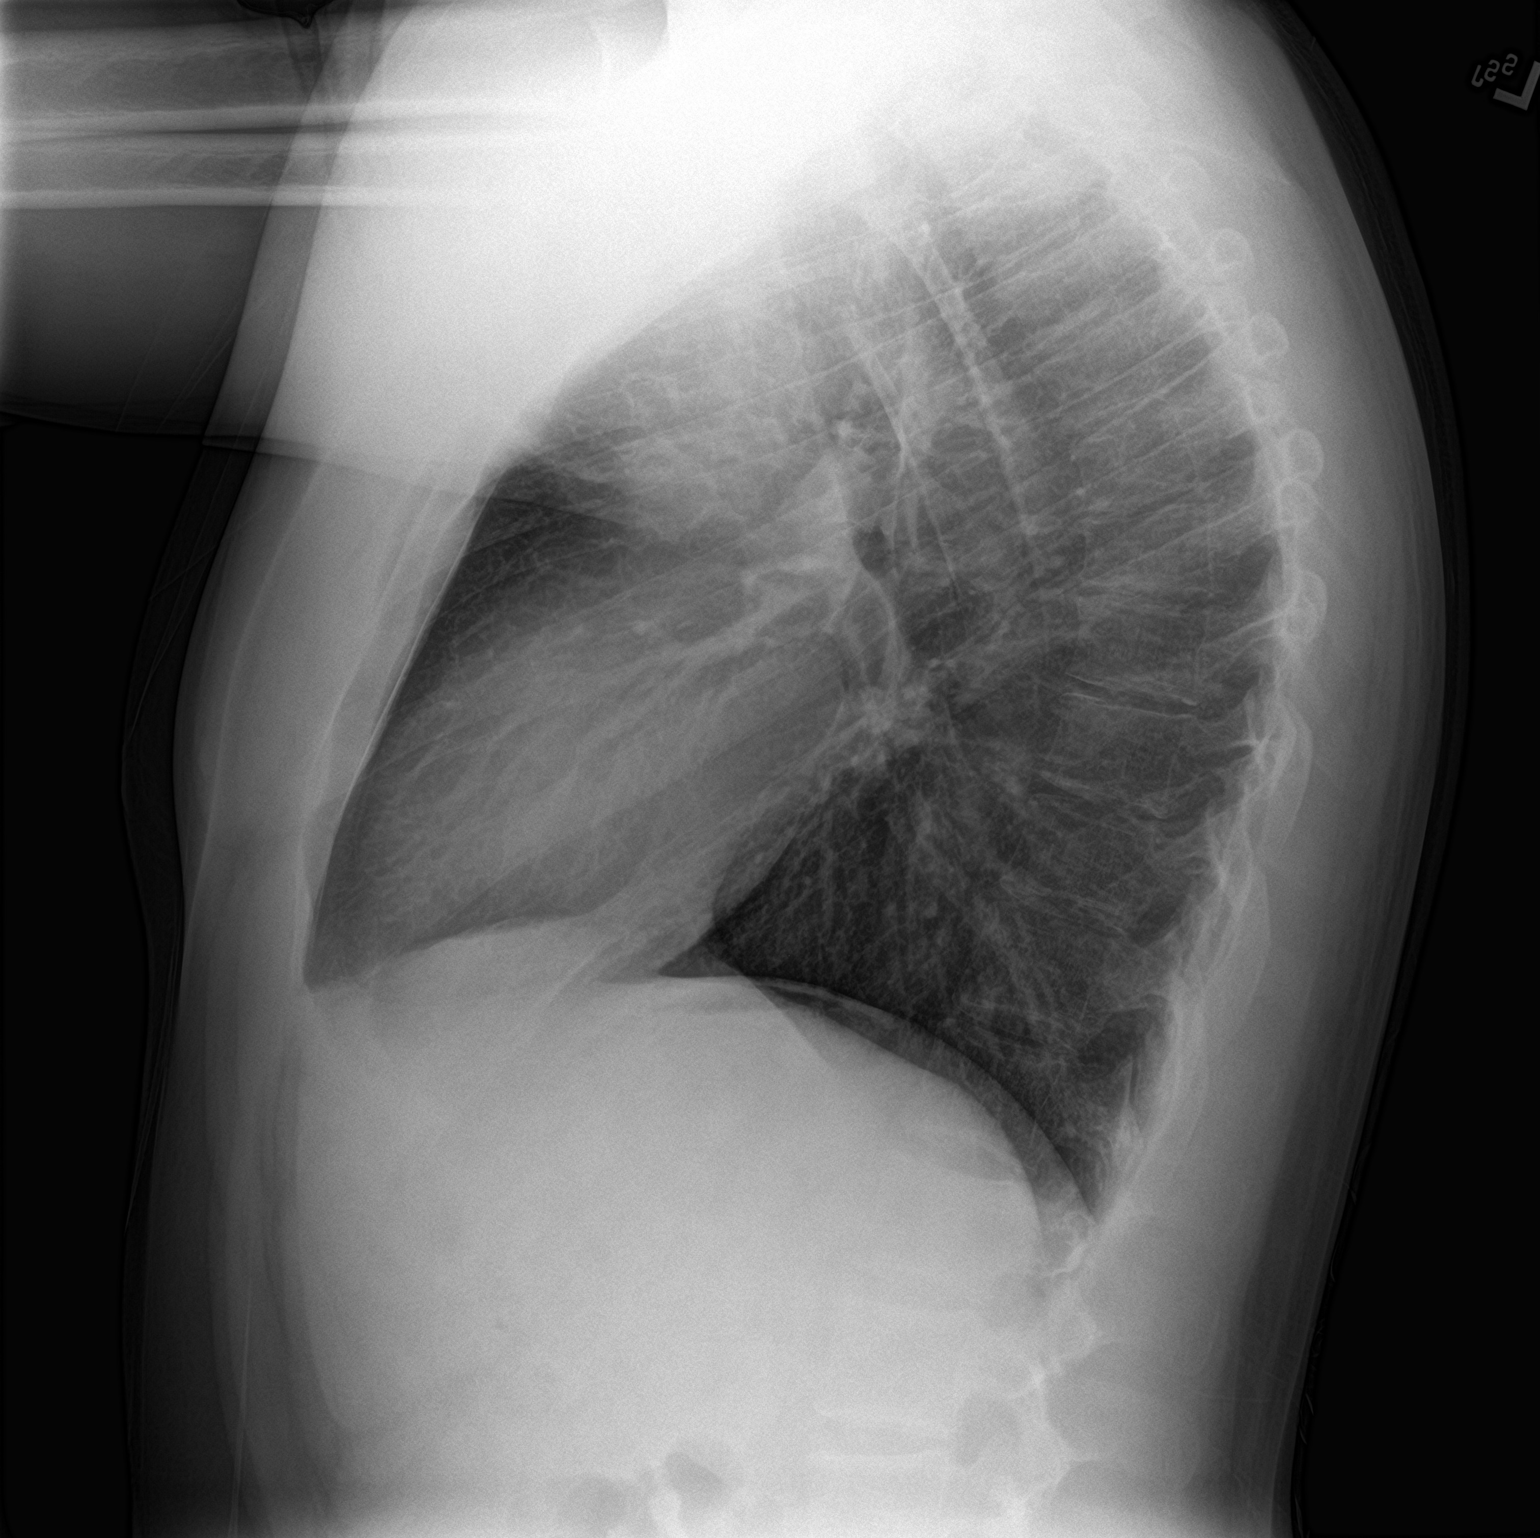

[2 of 2 positions shown; findings below may reference images not displayed]

FINDINGS: There is slight upper lobe scarring bilaterally. Lungs otherwise
clear. Heart size and pulmonary vascularity are normal. No
adenopathy. No pneumothorax. No bone lesions.
IMPRESSION: Slight upper lobe scarring bilaterally. Lungs otherwise clear.
Cardiac silhouette within normal limits.

## 2021-01-23 ENCOUNTER — Ambulatory Visit: Payer: Self-pay

## 2021-01-30 ENCOUNTER — Ambulatory Visit: Payer: Self-pay

## 2021-04-15 ENCOUNTER — Other Ambulatory Visit: Payer: Self-pay

## 2021-04-15 ENCOUNTER — Ambulatory Visit: Payer: Self-pay | Admitting: Family Medicine

## 2021-04-15 DIAGNOSIS — Z113 Encounter for screening for infections with a predominantly sexual mode of transmission: Secondary | ICD-10-CM

## 2021-04-15 LAB — HM HEPATITIS C SCREENING LAB: HM Hepatitis Screen: NEGATIVE

## 2021-04-15 LAB — GRAM STAIN

## 2021-04-15 LAB — HM HIV SCREENING LAB: HM HIV Screening: NEGATIVE

## 2021-04-15 NOTE — Progress Notes (Signed)
   [facility] Department STI clinic/screening visit  Subjective:  Anthony Avery is a 25 y.o. male being seen today for an STI screening visit. The patient reports they do not have symptoms.    Patient has the following medical conditions:   Patient Active Problem List   Diagnosis Date Noted  . Acute appendicitis 08/21/2019     Chief Complaint  Patient presents with  . SEXUALLY TRANSMITTED DISEASE    Screening     HPI  Patient reports here for screening   See flowsheet for further details and programmatic requirements.    The following portions of the patient's history were reviewed and updated as appropriate: allergies, current medications, past medical history, past social history, past surgical history and problem list.  Objective:  There were no vitals filed for this visit.  Physical Exam Constitutional:      Appearance: Normal appearance.  HENT:     Head: Normocephalic.     Mouth/Throat:     Mouth: Mucous membranes are moist.     Pharynx: Oropharynx is clear. No oropharyngeal exudate.  Genitourinary:    Penis: Normal and uncircumcised.      Testes: Normal.     Comments: No lice, nits, or pest, no lesions or odor discharge.  Denies pain or tenderness with paplation of testicles.  No lesions, ulcers or masses present.    Musculoskeletal:     Cervical back: Normal range of motion.  Lymphadenopathy:     Cervical: No cervical adenopathy.  Skin:    General: Skin is warm and dry.     Findings: No bruising, erythema, lesion or rash.  Neurological:     Mental Status: He is alert.  Psychiatric:        Mood and Affect: Mood normal.        Behavior: Behavior normal.       Assessment and Plan:  Anthony Avery is a 25 y.o. male presenting to the [facility] Department for STI screening  1. Screening examination for venereal disease  - Gonococcus culture - Gram stain - HBV Antigen/Antibody [facility] - HIV/HCV [place] [facility] -  Syphilis Serology, [place] [facility] - Gonococcus culture  Patient does not have STI symptoms Patient accepted all screenings including  Gram stain,  oral, urethral, CT/GC and bloodwork for HIV/RPR.  Patient meets criteria for HepB screening? Yes. Ordered? Yes Patient meets criteria for HepC screening? Yes. Ordered? Yes Recommended condom use with all sex Discussed importance of condom use for STI prevent  Per gram stain no treatment needed.   Discussed time line for [facility] results and that patient will be called with positive results and encouraged patient to call if he had not heard in 2 weeks Recommended returning for continued or worsening symptoms.    Return for as needed.  No future appointments.  Wendi Snipes, FNP

## 2021-04-15 NOTE — Progress Notes (Signed)
Gram Stain results reviewed with Provider, no treatment required. Condoms given. Berdie Ogren, RN

## 2021-04-20 LAB — GONOCOCCUS CULTURE

## 2022-01-28 ENCOUNTER — Encounter: Payer: Self-pay | Admitting: Family Medicine

## 2022-01-28 ENCOUNTER — Ambulatory Visit: Payer: Self-pay | Admitting: Family Medicine

## 2022-01-28 ENCOUNTER — Other Ambulatory Visit: Payer: Self-pay

## 2022-01-28 DIAGNOSIS — Z113 Encounter for screening for infections with a predominantly sexual mode of transmission: Secondary | ICD-10-CM

## 2022-01-28 LAB — HM HIV SCREENING LAB: HM HIV Screening: NEGATIVE

## 2022-01-28 LAB — GRAM STAIN

## 2022-01-28 NOTE — Progress Notes (Signed)
Pt here for STD screening.  Gram stain results reviewed, no treatment required per SO.  Anthony Mccowen M Madysyn Hanken, RN ° °

## 2022-01-28 NOTE — Progress Notes (Signed)
[facility] Department STI clinic/screening visit  Subjective:  Anthony Avery is a 26 y.o. male being seen today for an STI screening visit. The patient reports they do not have symptoms.    Patient has the following medical conditions:   Patient Active Problem List   Diagnosis Date Noted   Acute appendicitis 08/21/2019     Chief Complaint  Patient presents with   SEXUALLY TRANSMITTED DISEASE    HPI  Patient reports he is here for an STD screen.  Denies symptoms.  Does the patient or their partner desires a pregnancy in the next year? No  Screening for MPX risk: Does the patient have an unexplained rash? No Is the patient MSM? No Does the patient endorse multiple sex partners or anonymous sex partners? No Did the patient have close or sexual contact with a person diagnosed with MPX? No Has the patient traveled outside the [place] where MPX is endemic? No Is there a high clinical suspicion for MPX-- evidenced by one of the following No  -Unlikely to be chickenpox  -Lymphadenopathy  -Rash that present in same phase of evolution on any given body part   See flowsheet for further details and programmatic requirements.    The following portions of the patient's history were reviewed and updated as appropriate: allergies, current medications, past medical history, past social history, past surgical history and problem list.  Objective:  There were no vitals filed for this visit.   Physical Exam Constitutional:      Appearance: Normal appearance.  HENT:     Head: Normocephalic.  Genitourinary:    Pubic Area: No rash or pubic lice.      Penis: Normal and circumcised.      Testes: Normal.  Lymphadenopathy:     Cervical: No cervical adenopathy.     Right cervical: No superficial cervical adenopathy.    Left cervical: No superficial cervical adenopathy.     Upper Body:     Right upper body: No supraclavicular or axillary adenopathy.     Left upper body: No  supraclavicular or axillary adenopathy.     Lower Body: No right inguinal adenopathy. No left inguinal adenopathy.  Skin:    General: Skin is warm and dry.     Findings: No lesion or rash.  Neurological:     Mental Status: He is alert.  Psychiatric:        Behavior: Behavior normal.     Assessment and Plan:  Anthony Avery is a 26 y.o. male presenting to the [facility] Department for STI screening   1. Screening examination for venereal disease - Gram stain - Gonococcus culture - HIV [place] [facility] - Syphilis Serology, [facility] - Gonococcus culture    Return if symptoms worsen or fail to improve.  No future appointments.  Larene Pickett, FNP

## 2022-02-02 LAB — GONOCOCCUS CULTURE
